# Patient Record
Sex: Female | Born: 1960
Health system: Southern US, Community
[De-identification: ages and names within clinical notes are randomized; demographics above are authoritative.]

## PROBLEM LIST (undated history)

## (undated) HISTORY — PX: DENTAL SURGERY: SHX609

---

## 2000-01-20 ENCOUNTER — Encounter: Admission: RE | Admit: 2000-01-20 | Discharge: 2000-01-20 | Payer: Self-pay | Admitting: Family Medicine

## 2000-01-20 ENCOUNTER — Encounter: Payer: Self-pay | Admitting: Family Medicine

## 2001-04-10 ENCOUNTER — Emergency Department (HOSPITAL_COMMUNITY): Admission: EM | Admit: 2001-04-10 | Discharge: 2001-04-10 | Payer: Self-pay | Admitting: Emergency Medicine

## 2002-01-11 ENCOUNTER — Encounter: Admission: RE | Admit: 2002-01-11 | Discharge: 2002-01-11 | Payer: Self-pay | Admitting: Family Medicine

## 2002-01-11 ENCOUNTER — Encounter: Payer: Self-pay | Admitting: Family Medicine

## 2008-09-11 ENCOUNTER — Emergency Department (HOSPITAL_COMMUNITY): Admission: EM | Admit: 2008-09-11 | Discharge: 2008-09-11 | Payer: Self-pay | Admitting: Emergency Medicine

## 2009-08-06 ENCOUNTER — Emergency Department (HOSPITAL_COMMUNITY): Admission: EM | Admit: 2009-08-06 | Discharge: 2009-08-06 | Payer: Self-pay | Admitting: Emergency Medicine

## 2010-05-18 LAB — RAPID STREP SCREEN (MED CTR MEBANE ONLY): Streptococcus, Group A Screen (Direct): NEGATIVE

## 2010-05-18 LAB — STREP A DNA PROBE: Group A Strep Probe: NEGATIVE

## 2010-06-08 LAB — CBC
HCT: 27.5 % — ABNORMAL LOW (ref 36.0–46.0)
Platelets: 419 10*3/uL — ABNORMAL HIGH (ref 150–400)
RDW: 22.3 % — ABNORMAL HIGH (ref 11.5–15.5)
WBC: 9 10*3/uL (ref 4.0–10.5)

## 2010-06-08 LAB — DIFFERENTIAL
Basophils Absolute: 0 10*3/uL (ref 0.0–0.1)
Eosinophils Absolute: 0 10*3/uL (ref 0.0–0.7)
Eosinophils Relative: 0 % (ref 0–5)
Lymphs Abs: 1.4 10*3/uL (ref 0.7–4.0)
Monocytes Absolute: 0.6 10*3/uL (ref 0.1–1.0)
Neutrophils Relative %: 78 % — ABNORMAL HIGH (ref 43–77)

## 2010-07-01 IMAGING — CT CT NECK W/ CM
3 of 4 series · 16 of 33 positions shown, 19 images · IV contrast (agent unspecified)
Comparison: None

CLINICAL DATA: Left facial pain.  Decreased vision left dye.
Swelling.

CT NECK WITH CONTRAST
TECHNIQUE: Multidetector CT imaging of the neck was performed with
intravenous contrast.
Contrast: 100 ml Hmnipaque-XMM

[Series 3: neck st · axial · 0.39mm/px · z∈[-229,-94]mm · 8 of 59 slices shown, 10 images]
[im 7/59  soft-tissue]
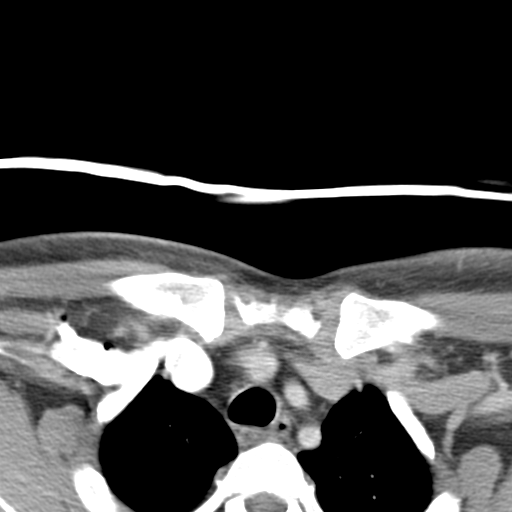
[im 7/59  bone]
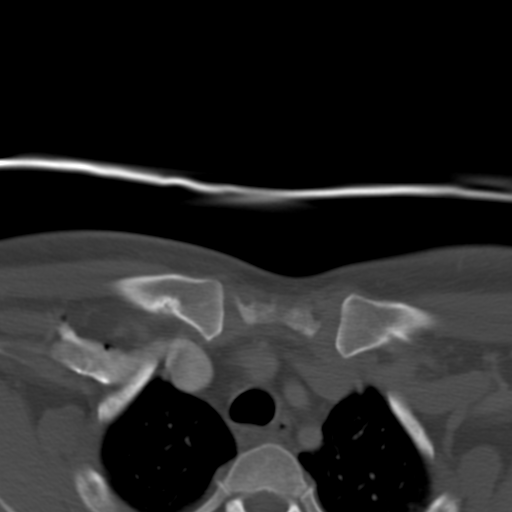
[im 13/59  bone]
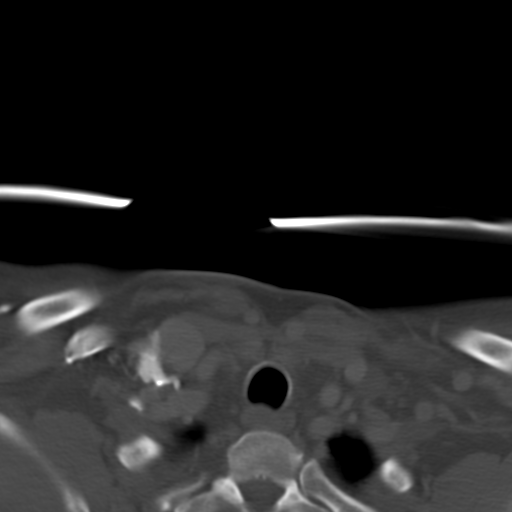
[im 20/59  bone]
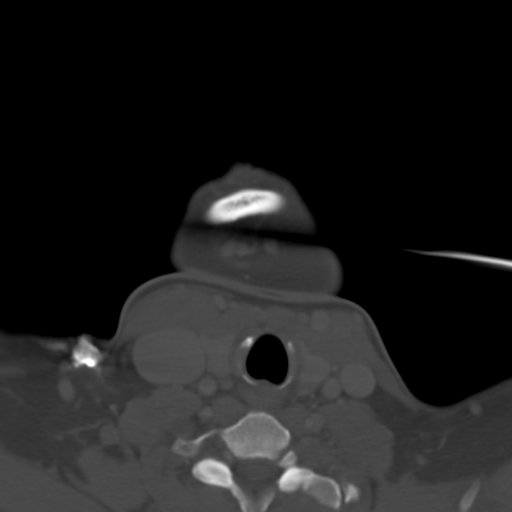
[im 26/59  bone]
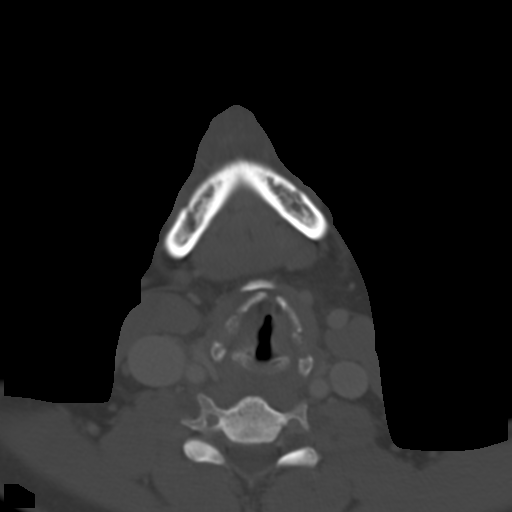
[im 33/59  soft-tissue]
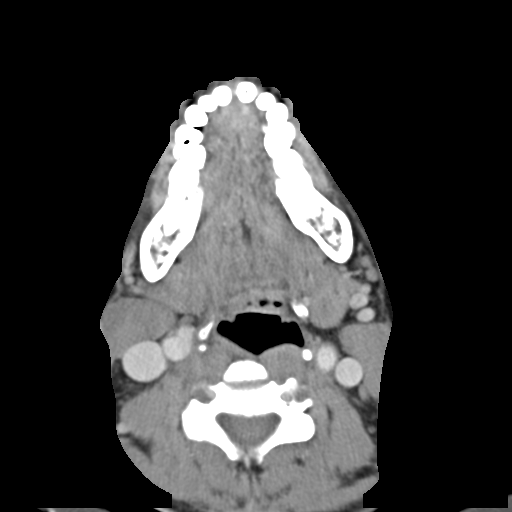
[im 33/59  bone]
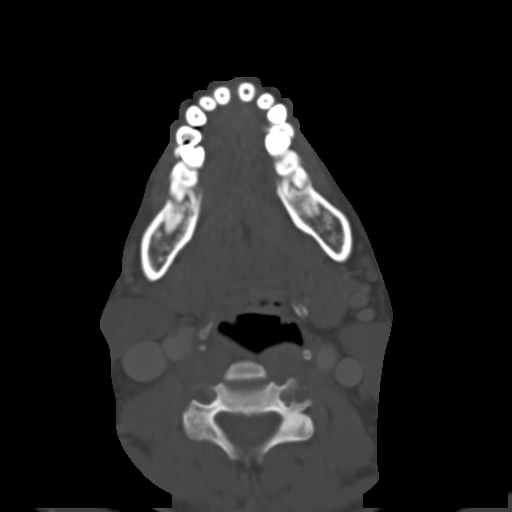
[im 39/59  bone]
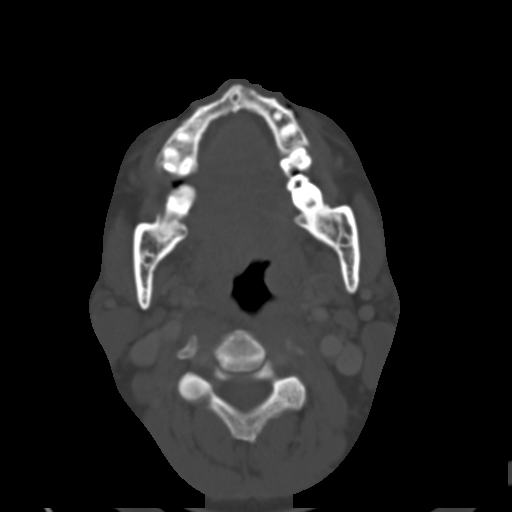
[im 46/59  bone]
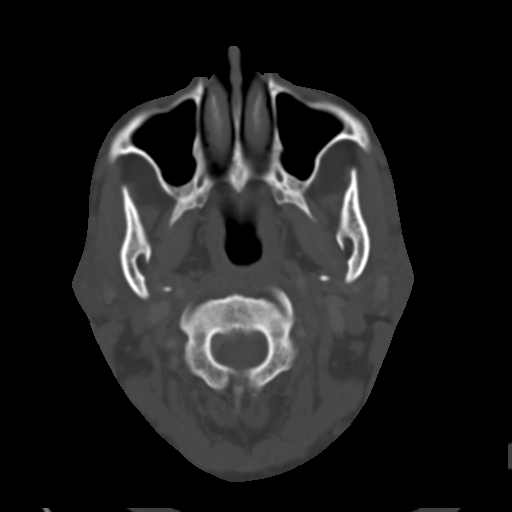
[im 52/59  bone]
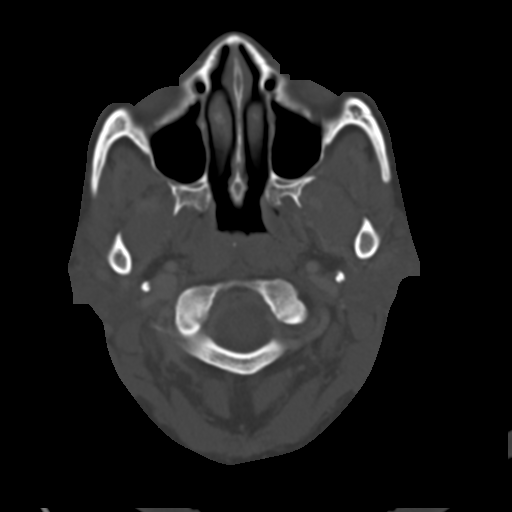

[Series 602: coronal images · coronal · 0.39mm/px · 3 of 98 slices shown]
[im 20/98  bone]
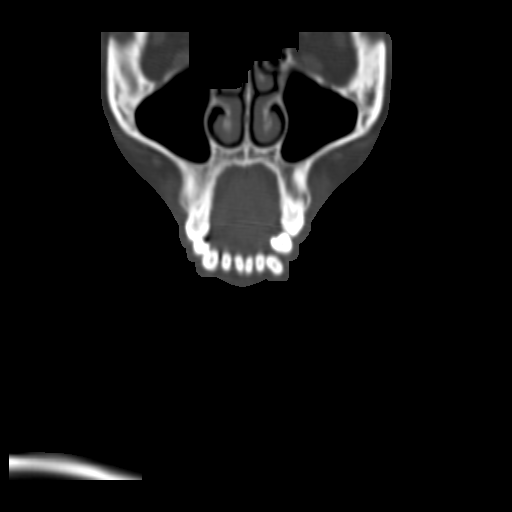
[im 39/98  bone]
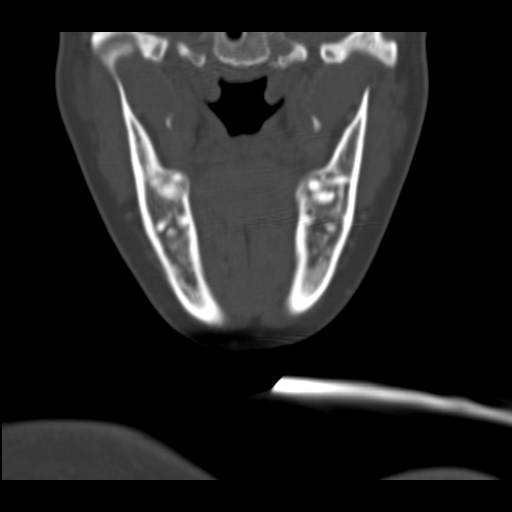
[im 59/98  bone]
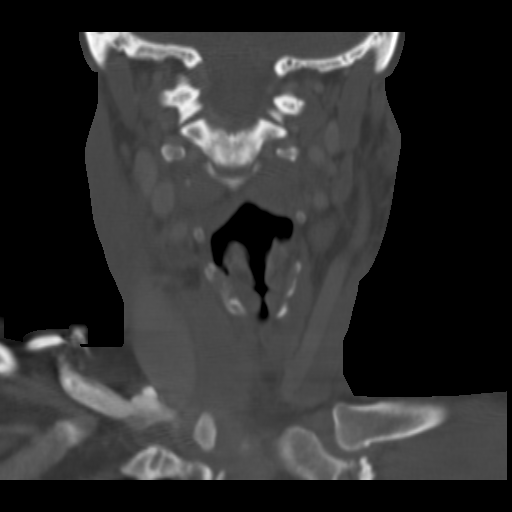

[Series 603: sagittal images · sagittal · 0.39mm/px · 5 of 95 slices shown, 6 images]
[im 32/95  bone]
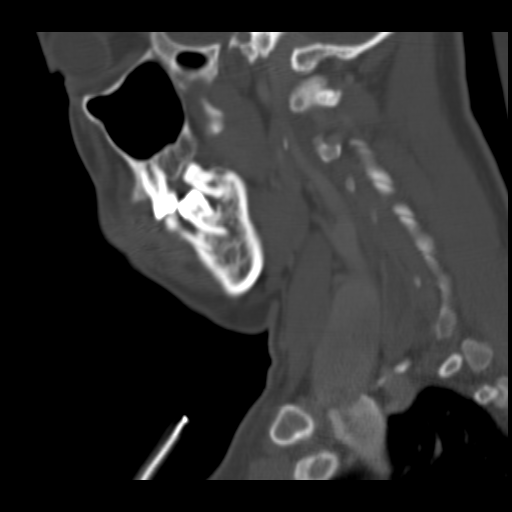
[im 40/95  bone]
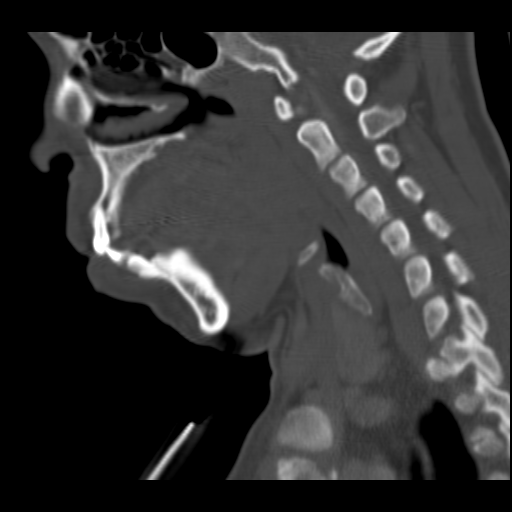
[im 48/95  soft-tissue]
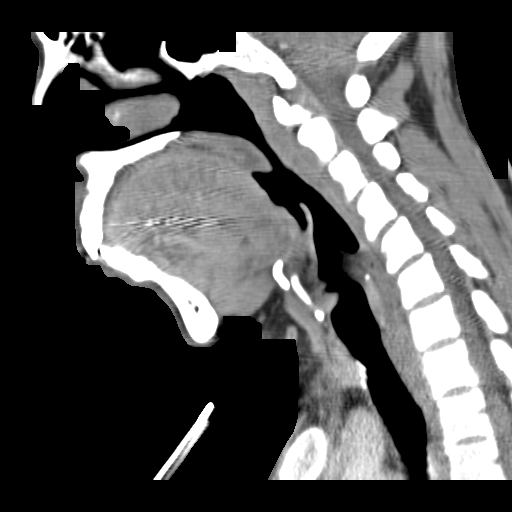
[im 48/95  bone]
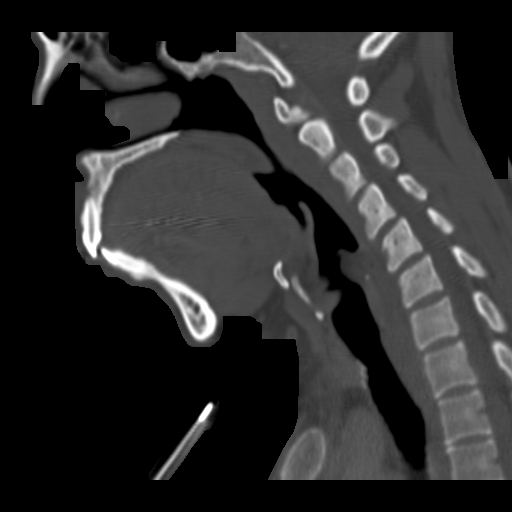
[im 55/95  bone]
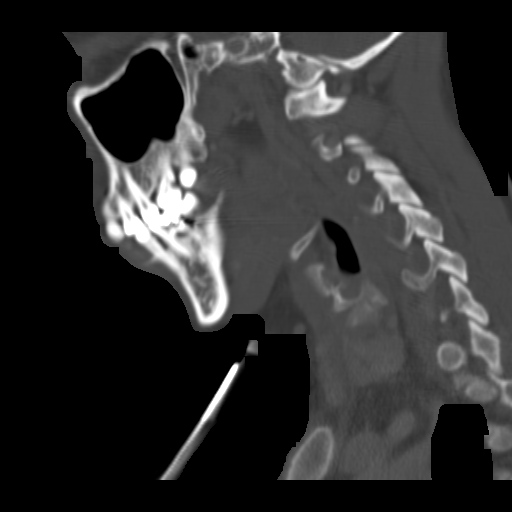
[im 63/95  bone]
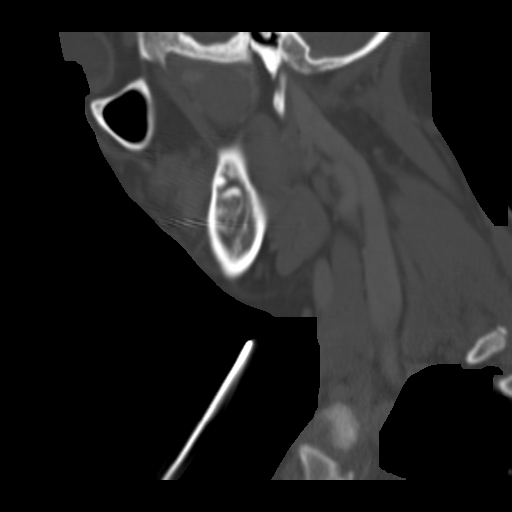

[16 of 33 positions shown; findings below may reference images not displayed]

FINDINGS: Visualized intracranial contents are unremarkable.  Lung
apices are clear.  No superior mediastinal pathology is seen.

Parotid, submandibular and thyroid glands appear normal.  There are
no pathologically enlarged lymph nodes.  There is no evidence of
tonsillar or peritonsillar abscess.  No sign of mucosal or
submucosal mass lesion.  I do not see a any inflammatory changes in
the region superficially.  Visualized sinuses, middle ears and
mastoids are clear.  No osseous lesion is seen.  No apparent root
abscess.
IMPRESSION: Negative CT scan.  No lesion seen to explain the patient's
described symptoms.

## 2014-07-10 ENCOUNTER — Emergency Department (HOSPITAL_COMMUNITY)
Admission: EM | Admit: 2014-07-10 | Discharge: 2014-07-10 | Disposition: A | Discharge status: Home / Self Care | Payer: Commercial Managed Care - PPO | Attending: Emergency Medicine | Admitting: Emergency Medicine

## 2014-07-10 ENCOUNTER — Encounter (HOSPITAL_COMMUNITY): Payer: Self-pay | Admitting: Emergency Medicine

## 2014-07-10 DIAGNOSIS — K047 Periapical abscess without sinus: Secondary | ICD-10-CM | POA: Diagnosis not present

## 2014-07-10 DIAGNOSIS — K088 Other specified disorders of teeth and supporting structures: Secondary | ICD-10-CM | POA: Diagnosis present

## 2014-07-10 DIAGNOSIS — Z720 Tobacco use: Secondary | ICD-10-CM | POA: Diagnosis not present

## 2014-07-10 MED ORDER — HYDROCODONE-ACETAMINOPHEN 5-325 MG PO TABS: 2.0000 | ORAL_TABLET | ORAL | Status: AC | PRN

## 2014-07-10 MED ORDER — AMOXICILLIN-POT CLAVULANATE 875-125 MG PO TABS: 1.0000 | ORAL_TABLET | Freq: Two times a day (BID) | ORAL | Status: AC

## 2014-07-10 NOTE — ED Provider Notes (Signed)
CSN: [id number]     Arrival date & time 07/10/14  [id number] History   First MD Initiated Contact with Patient 07/10/14 [phone]     Chief Complaint  Patient presents with  . Dental Pain     (Consider location/radiation/quality/duration/timing/severity/associated sxs/prior Treatment) HPI Comments: Patient presents to the emergency department for evaluation of pain and swelling around her upper central teeth that started yesterday. Patient reports progressively worsening, constant pain. The gums above her left upper central incisors very swollen and tender.  Patient is a 54 y.o. female presenting with tooth pain.  Dental Pain Associated symptoms: no fever     History reviewed. No pertinent past medical history. History reviewed. No pertinent past surgical history. No family history on file. History  Substance Use Topics  . Smoking status: Current Every Day Smoker  . Smokeless tobacco: Not on file  . Alcohol Use: Yes   OB History    No data available     Review of Systems  Constitutional: Negative for fever.  HENT: Positive for dental problem.   All other systems reviewed and are negative.     Allergies  Review of patient's allergies indicates not on file.  Home Medications   Prior to Admission medications   Not on File   BP 158/77 mmHg  Pulse 117  Temp(Src) 99.8 F (37.7 C) (Oral)  Resp 18  SpO2 98% Physical Exam  Constitutional: She is oriented to person, place, and time. She appears well-developed and well-nourished. No distress.  HENT:  Head: Normocephalic and atraumatic.  Right Ear: Hearing normal.  Left Ear: Hearing normal.  Nose: Nose normal.  Mouth/Throat: Oropharynx is clear and moist and mucous membranes are normal. Dental abscesses present.    Eyes: Conjunctivae and EOM are normal. Pupils are equal, round, and reactive to light.  Neck: Normal range of motion. Neck supple.  Cardiovascular: Regular rhythm, S1 normal and S2 normal.  Exam reveals no gallop  and no friction rub.   No murmur heard. Pulmonary/Chest: Effort normal and breath sounds normal. No respiratory distress. She exhibits no tenderness.  Abdominal: Soft. Normal appearance and bowel sounds are normal. There is no hepatosplenomegaly. There is no tenderness. There is no rebound, no guarding, no tenderness at McBurney's point and negative Murphy's sign. No hernia.  Musculoskeletal: Normal range of motion.  Neurological: She is alert and oriented to person, place, and time. She has normal strength. No cranial nerve deficit or sensory deficit. Coordination normal. GCS eye subscore is 4. GCS verbal subscore is 5. GCS motor subscore is 6.  Skin: Skin is warm, dry and intact. No rash noted. No cyanosis.  Psychiatric: She has a normal mood and affect. Her speech is normal and behavior is normal. Thought content normal.  Nursing note and vitals reviewed.   ED Course  Procedures (including critical care time)  INCISION AND DRAINAGE Performed by: Gilda CreasePOLLINA, Jatara Huettner J. Consent: Verbal consent obtained. Risks and benefits: risks, benefits and alternatives were discussed Type: abscess  Body area: mouth  Anesthesia: local infiltration  Incision was made with 18G.  Local anesthetic: benzocaine 20% topical gel  Complexity: simple  Drainage: purulent  Drainage amount: moderate    Patient tolerance: Patient tolerated the procedure well with no immediate complications.    Labs Review Labs Reviewed - No data to display  Imaging Review No results found.   EKG Interpretation None      MDM   Final diagnoses:  None   dental abscess  Patient presents with dental abscess  above the left upper central incisor. Area was drained without difficulty. Patient will be initiated on antibiotics. She has dental follow-up scheduled in one week.    Gilda Creasehristopher J Kailynn Satterly, MD 07/10/14 [phone]

## 2014-07-10 NOTE — ED Notes (Signed)
Per pt, states upper teeth problem-has appointment with dentist on the 17 th-facial swelling

## 2014-07-10 NOTE — ED Notes (Addendum)
Pt escorted to discharge window. Verbalized understanding discharge instructions. In no acute distress.  Pt educated to complete antibiotic, follow up w/ dentist, and not to drive or operate heavy machinery while taking pain medication.

## 2014-07-10 NOTE — Discharge Instructions (Signed)

## 2014-08-21 ENCOUNTER — Ambulatory Visit (INDEPENDENT_AMBULATORY_CARE_PROVIDER_SITE_OTHER): Payer: Commercial Managed Care - PPO | Admitting: Family Medicine

## 2014-08-21 DIAGNOSIS — R69 Illness, unspecified: Secondary | ICD-10-CM

## 2014-08-21 NOTE — Progress Notes (Signed)
NO SHOW

## 2014-08-23 ENCOUNTER — Ambulatory Visit: Payer: Self-pay | Admitting: Family Medicine

## 2018-12-18 ENCOUNTER — Other Ambulatory Visit: Payer: Self-pay

## 2018-12-18 ENCOUNTER — Emergency Department (HOSPITAL_COMMUNITY)
Admission: EM | Admit: 2018-12-18 | Discharge: 2018-12-18 | Disposition: A | Discharge status: Home / Self Care | Payer: BC Managed Care – PPO | Attending: Emergency Medicine | Admitting: Emergency Medicine

## 2018-12-18 ENCOUNTER — Encounter (HOSPITAL_COMMUNITY): Payer: Self-pay

## 2018-12-18 DIAGNOSIS — I10 Essential (primary) hypertension: Secondary | ICD-10-CM | POA: Diagnosis not present

## 2018-12-18 DIAGNOSIS — R030 Elevated blood-pressure reading, without diagnosis of hypertension: Secondary | ICD-10-CM | POA: Diagnosis not present

## 2018-12-18 DIAGNOSIS — I160 Hypertensive urgency: Secondary | ICD-10-CM | POA: Diagnosis not present

## 2018-12-18 DIAGNOSIS — F17210 Nicotine dependence, cigarettes, uncomplicated: Secondary | ICD-10-CM | POA: Diagnosis not present

## 2018-12-18 LAB — BASIC METABOLIC PANEL
Anion gap: 12 (ref 5–15)
BUN: 11 mg/dL (ref 6–20)
CO2: 21 mmol/L — ABNORMAL LOW (ref 22–32)
Calcium: 9.1 mg/dL (ref 8.9–10.3)
Chloride: 105 mmol/L (ref 98–111)
Creatinine, Ser: 0.58 mg/dL (ref 0.44–1.00)
GFR calc Af Amer: >60 mL/min (ref >60)
GFR calc non Af Amer: >60 mL/min (ref >60)
Glucose, Bld: 96 mg/dL (ref 70–99)
Potassium: 4.1 mmol/L (ref 3.5–5.1)
Sodium: 138 mmol/L (ref 135–145)

## 2018-12-18 LAB — CBC WITH DIFFERENTIAL/PLATELET
Abs Immature Granulocytes: 0.04 10*3/uL (ref 0.00–0.07)
Basophils Absolute: 0.1 10*3/uL (ref 0.0–0.1)
Basophils Relative: 1 %
Eosinophils Absolute: 0 10*3/uL (ref 0.0–0.5)
Eosinophils Relative: 0 %
HCT: 44 % (ref 36.0–46.0)
Hemoglobin: 13.5 g/dL (ref 12.0–15.0)
Immature Granulocytes: 0 %
Lymphocytes Relative: 13 %
Lymphs Abs: 1.4 10*3/uL (ref 0.7–4.0)
MCH: 27.2 pg (ref 26.0–34.0)
MCHC: 30.7 g/dL (ref 30.0–36.0)
MCV: 88.7 fL (ref 80.0–100.0)
Monocytes Absolute: 0.6 10*3/uL (ref 0.1–1.0)
Monocytes Relative: 6 %
Neutro Abs: 8.4 10*3/uL — ABNORMAL HIGH (ref 1.7–7.7)
Neutrophils Relative %: 80 %
Platelets: 279 10*3/uL (ref 150–400)
RBC: 4.96 MIL/uL (ref 3.87–5.11)
RDW: 13.2 % (ref 11.5–15.5)
WBC: 10.5 10*3/uL (ref 4.0–10.5)
nRBC: 0 % (ref 0.0–0.2)

## 2018-12-18 LAB — TROPONIN I (HIGH SENSITIVITY): Troponin I (High Sensitivity): <2 ng/L (ref <18)

## 2018-12-18 NOTE — ED Provider Notes (Signed)
[facility]-EMERGENCY DEPT Provider Note   CSN: [id number] Arrival date & time: 12/18/18  1305     History   Chief Complaint Chief Complaint  Patient presents with  . Hypertension    HPI Katherine Jacobs is a 58 y.o. female.     58 y.o female with a PMH of HTN [facility].  Patient reports he was going to get a procedure at the dentist, she needed to have her wisdom teeth pulled but they have been bothering her for weeks.  She reports when they took her blood pressure they found it to be along a systolic in the 200s with a diastolic in the high 90s.  She then proceeded to seek care at urgent care in which the measurements read about 190 systolic and 80s diastolic.  She arrived in the ED ambulatory with a steady gait.  She denies any headache, chest pain, shortness of breath, prior history of kidney disease, lightheaded, dizzy or weak.  Of note, patient does not have a primary care physician at this time, reports she has not had a physical in several years. She does voice a prior history of "flutter "during pregnancy with her to kids.  The history is provided by the patient and medical records.  Hypertension Pertinent negatives include no chest pain, no abdominal pain, no headaches and no shortness of breath.    History reviewed. No pertinent past medical history.  There are no active problems to display for this patient.   Past Surgical History:  Procedure Laterality Date  . DENTAL SURGERY       OB History   No obstetric history on file.      Home Medications    Prior to Admission medications   Medication Sig Start Date End Date Taking? Authorizing Provider  amoxicillin-clavulanate (AUGMENTIN) 875-125 MG per tablet Take 1 tablet by mouth every 12 (twelve) hours. 07/10/14   Gilda CreasePollina, Christopher J, MD  HYDROcodone-acetaminophen (NORCO/VICODIN) 5-325 MG per tablet Take 2 tablets by mouth every 4 (four)  hours as needed for moderate pain. 07/10/14   Gilda CreasePollina, Christopher J, MD    Family History Family History  Problem Relation Age of Onset  . Diabetes Mother   . Chronic Renal Failure Mother     Social History Social History   Tobacco Use  . Smoking status: Current Every Day Smoker    Packs/day: 1.00    Types: Cigarettes  . Smokeless tobacco: Former NeurosurgeonUser    Types: Snuff  Substance Use Topics  . Alcohol use: Yes  . Drug use: No     Allergies   Patient has no known allergies.   Review of Systems Review of Systems  Constitutional: Negative for fever.  HENT: Negative for sinus pressure.   Respiratory: Negative for shortness of breath.   Cardiovascular: Negative for chest pain.  Gastrointestinal: Negative for abdominal pain, nausea and vomiting.  Genitourinary: Negative for flank pain.  Musculoskeletal: Negative for back pain.  Skin: Negative for pallor and wound.  Neurological: Negative for dizziness, syncope, facial asymmetry, weakness, light-headedness, numbness and headaches.     Physical Exam Updated Vital Signs BP (!) 176/75   Pulse 99   Temp 98.5 F (36.9 C) (Oral)   Resp 14   Ht 5\' 4"  (1.626 m)   Wt 68 kg   SpO2 100%   BMI 25.75 kg/m   Physical Exam Vitals signs and nursing note reviewed.  Constitutional:  General: She is not in acute distress.    Appearance: She is well-developed.  HENT:     Head: Normocephalic and atraumatic.     Mouth/Throat:     Pharynx: No oropharyngeal exudate.  Eyes:     Pupils: Pupils are equal, round, and reactive to light.  Neck:     Musculoskeletal: Normal range of motion.  Cardiovascular:     Rate and Rhythm: Regular rhythm.     Heart sounds: Normal heart sounds.  Pulmonary:     Effort: Pulmonary effort is normal. No respiratory distress.     Breath sounds: Normal breath sounds.  Abdominal:     General: Bowel sounds are normal. There is no distension.     Palpations: Abdomen is soft.     Tenderness: There  is no abdominal tenderness.  Musculoskeletal:        General: No tenderness or deformity.     Right lower leg: No edema.     Left lower leg: No edema.  Skin:    General: Skin is warm and dry.  Neurological:     Mental Status: She is alert and oriented to person, place, and time.     Comments: Alert, oriented, thought content appropriate. Speech fluent without evidence of aphasia. Able to follow 2 step commands without difficulty.  Cranial Nerves:  II:  Peripheral visual fields grossly normal, pupils, round, reactive to light III,IV, VI: ptosis not present, extra-ocular motions intact bilaterally  V,VII: smile symmetric, facial light touch sensation equal VIII: hearing grossly normal bilaterally  IX,X: midline uvula rise  XI: bilateral shoulder shrug equal and strong XII: midline tongue extension  Motor:  5/5 in upper and lower extremities bilaterally including strong and equal grip strength and dorsiflexion/plantar flexion Sensory: light touch normal in all extremities.  Cerebellar: normal finger-to-nose with bilateral upper extremities, pronator drift negative Gait: normal gait and balance       ED Treatments / Results  Labs (all labs ordered are listed, but only abnormal results are displayed) Labs Reviewed  CBC WITH DIFFERENTIAL/PLATELET  BASIC METABOLIC PANEL  TROPONIN I (HIGH SENSITIVITY)    EKG EKG Interpretation  Date/Time:  Monday December 18 2018 14:10:37 EDT Ventricular Rate:  85 PR Interval:    QRS Duration: 91 QT Interval:  369 QTC Calculation: 439 R Axis:   -21 Text Interpretation:  Sinus rhythm Borderline left axis deviation Probable anteroseptal infarct, old No old tracing to compare Confirmed by Meridee Score [phone]) on 12/18/2018 2:15:15 PM   Radiology No results found.  Procedures Procedures (including critical care time)  Medications Ordered in ED Medications - No data to display   Initial Impression / Assessment and Plan / ED Course  I  have reviewed the triage vital signs and the nursing notes.  Pertinent labs & imaging results that were available during my care of the patient were reviewed by me and considered in my medical decision making (see chart for details).        Patient with no pertinent past medical history presents to the ED by urgent care due to hypertensive urgency.  Patient had a procedure scheduled for her teeth removal today by her dentist, had her blood pressure checked upon arrival she was found to be hypertensive with a systolic in 200s, diastolic in the 90s.  She was then rescreened in urgent care, had a blood pressure in the 190 systolic, 90s diastolic.  She arrived in the ED with with blood pressures around the same, however this seems  somewhat elevated in the 100 range, no hypoxia, remains afebrile.  She is overall well-appearing without any chest pain, shortness of breath, headaches or dizziness.  Patient reports she has not seen a primary care provider in several years.  According to her chart we have extensively reviewed, she does have a previous visit 4 years ago in the ED, then her blood pressure was around the 728 systolic, and 77 diastolic.  Heart rate was found to be elevated around 117.  Due to patient's symptomatic visit but lack of medical screening will obtain some basic blood work along with an EKG to further evaluate.  Suspect the patient's hypertension history has been ongoing for several years.  BMP without any electrolyte of the regimen, glucose levels within normal limits.  Current level is preserved.  First troponin is negative.  CBC without any leukocytosis, lites are within normal limits otherwise.  EKG showed no changes consistent with ischemia or infarct.  Patient remains to be asymptomatic, blood pressure has improved at 176/75, I do not feel that therapy is needed at this time.  Results were discussed with patient, advised to set up an appointment with PCP, she reports she will do this  in the upcoming week.  Return precautions discussed at length.   Portions of this note were generated with Lobby[employer]. Dictation errors may occur despite best attempts at proofreading.  Final Clinical Impressions(s) / ED Diagnoses   Final diagnoses:  Elevated blood pressure reading    ED Discharge Orders    None       Janeece Fitting, PA-C 12/18/18 1635    Hayden Rasmussen, MD 12/18/18 1930

## 2018-12-18 NOTE — Discharge Instructions (Addendum)
Your lab results were within normal limits today.  Please schedule an appointment with your primary care physician in order to have your blood pressure rechecked.  If he experience any headache, chest pain, shortness of breath or dizziness you may return to the emergency department.

## 2018-12-18 NOTE — ED Notes (Signed)
ED Provider at bedside. 

## 2018-12-18 NOTE — ED Triage Notes (Signed)
Patient went to the dentist today and when they took her BP it was elevated and would not do her scheduled procedure.  BP in triage- 195/89

## 2019-02-20 DIAGNOSIS — E663 Overweight: Secondary | ICD-10-CM | POA: Diagnosis not present

## 2019-02-20 DIAGNOSIS — I10 Essential (primary) hypertension: Secondary | ICD-10-CM | POA: Diagnosis not present

## 2019-02-20 DIAGNOSIS — L83 Acanthosis nigricans: Secondary | ICD-10-CM | POA: Diagnosis not present
